# Patient Record
Sex: Male | Born: 2000 | Race: White | Hispanic: No | Marital: Single | Smoking: Never smoker
Health system: Southern US, Community
[De-identification: ages and names within clinical notes are randomized; demographics above are authoritative.]

## PROBLEM LIST (undated history)

## (undated) DIAGNOSIS — E063 Autoimmune thyroiditis: Secondary | ICD-10-CM

## (undated) DIAGNOSIS — J45909 Unspecified asthma, uncomplicated: Secondary | ICD-10-CM

## (undated) DIAGNOSIS — G9349 Other encephalopathy: Secondary | ICD-10-CM

## (undated) HISTORY — PX: TONSILLECTOMY: SUR1361

## (undated) HISTORY — PX: ADENOIDECTOMY: SUR15

---
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
## 2020-07-31 ENCOUNTER — Encounter (INDEPENDENT_AMBULATORY_CARE_PROVIDER_SITE_OTHER): Payer: Self-pay
